# Patient Record
Sex: Male | Born: 1970 | Race: White | Hispanic: No | Marital: Single | Smoking: Never smoker
Health system: Southern US, Community
[De-identification: ages and names within clinical notes are randomized; demographics above are authoritative.]

---

## 2005-10-24 ENCOUNTER — Ambulatory Visit: Payer: Self-pay | Admitting: Family Medicine

## 2005-10-24 ENCOUNTER — Inpatient Hospital Stay (HOSPITAL_COMMUNITY): Admission: EM | Admit: 2005-10-24 | Discharge: 2005-10-27 | Payer: Self-pay | Admitting: Emergency Medicine

## 2005-11-01 ENCOUNTER — Ambulatory Visit: Payer: Self-pay | Admitting: Sports Medicine
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
## 2016-07-07 ENCOUNTER — Ambulatory Visit (INDEPENDENT_AMBULATORY_CARE_PROVIDER_SITE_OTHER): Payer: 59 | Admitting: Physician Assistant

## 2016-07-07 VITALS — BP 122/68 | HR 112 | Temp 98.7°F | Resp 17 | Ht 73.5 in | Wt 166.0 lb

## 2016-07-07 DIAGNOSIS — A084 Viral intestinal infection, unspecified: Secondary | ICD-10-CM

## 2016-07-07 NOTE — Progress Notes (Signed)
Patient ID: Matthew Farmer, male     DOB: 1970-07-04, 46 y.o.    MRN: [id number]  PCP: No PCP Per Patient  Chief Complaint  Patient presents with  . Nausea  . Emesis    Subjective:   This patient is new to this practice and presents for work note.  Wednesday morning developed nausea. No trigger identified. Stayed home from work. Developed vomiting on Thursday night. Vomiting resolved, but nausea persisted and he wasn't really eating until Sunday.  No diarrhea. No fever, chills. Urine darkened and reduced volume, but returned to normal.  Feels back to normal now, but work requires a note to return after his absence for illness.   Review of Systems As above, all resolved.  Prior to Admission medications   Not on File     Not on File   There are no active problems to display for this patient.    Family History  Problem Relation Age of Onset  . Macular degeneration Mother      Social History   Social History  . Marital status: Single    Spouse name: N/A  . Number of children: 0  . Years of education: some college   Occupational History  . [employer]    Social History Main Topics  . Smoking status: Never Smoker  . Smokeless tobacco: Never Used  . Alcohol use No  . Drug use: No  . Sexual activity: No   Other Topics Concern  . Not on file   Social History Narrative   Lives alone.   Mother, step-father and twin brother life in [place], [place].   Sister lives in [place], [place].         Objective:  Physical Exam  Constitutional: He is oriented to person, place, and time. He appears well-developed and well-nourished. He is active and cooperative. No distress.  BP 122/68 (BP Location: Left Arm, Cuff Size: Large)   Pulse (!) 112   Temp 98.7 F (37.1 C) (Oral)   Resp 17   Ht 6' 1.5" (1.867 m)   Wt 166 lb (75.3 kg)   SpO2 97%   BMI 21.60 kg/m   HENT:  Head: Normocephalic and atraumatic.  Right Ear: Hearing normal.    Left Ear: Hearing normal.  Eyes: Conjunctivae are normal. No scleral icterus.  Neck: Normal range of motion. Neck supple. No thyromegaly present.  Cardiovascular: Normal rate, regular rhythm and normal heart sounds.   Pulses:      Radial pulses are 2+ on the right side, and 2+ on the left side.  Pulmonary/Chest: Effort normal and breath sounds normal.  Lymphadenopathy:       Head (right side): No tonsillar, no preauricular, no posterior auricular and no occipital adenopathy present.       Head (left side): No tonsillar, no preauricular, no posterior auricular and no occipital adenopathy present.    He has no cervical adenopathy.       Right: No supraclavicular adenopathy present.       Left: No supraclavicular adenopathy present.  Neurological: He is alert and oriented to person, place, and time. No sensory deficit.  Skin: Skin is warm, dry and intact. No rash noted. No cyanosis or erythema. Nails show no clubbing.  Psychiatric: He has a normal mood and affect. His speech is normal and behavior is normal.        Assessment & Plan:  1. Viral gastroenteritis Resolved. Note to RTW without restrictions.  No Follow-up on file.   Fernande Bras, PA-C Primary [facility] at [facility]

## 2016-07-07 NOTE — Patient Instructions (Signed)
     IF you received an x-ray today, you will receive an invoice from [facility]. Please contact [facility] at [phone] with questions or concerns regarding your invoice.   IF you received labwork today, you will receive an invoice from [facility]. Please contact [facility] at [phone] with questions or concerns regarding your invoice.   Our billing staff will not be able to assist you with questions regarding bills from these companies.  You will be contacted with the lab results as soon as they are available. The fastest way to get your results is to activate your My Chart account. Instructions are located on the last page of this paperwork. If you have not heard from us regarding the results in 2 weeks, please contact this office.     

## 2018-11-07 ENCOUNTER — Ambulatory Visit (INDEPENDENT_AMBULATORY_CARE_PROVIDER_SITE_OTHER): Payer: 59 | Admitting: Emergency Medicine

## 2018-11-07 ENCOUNTER — Encounter: Payer: Self-pay | Admitting: Emergency Medicine

## 2018-11-07 ENCOUNTER — Other Ambulatory Visit: Payer: Self-pay

## 2018-11-07 VITALS — BP 127/83 | HR 65 | Temp 98.0°F | Resp 14 | Wt 140.8 lb

## 2018-11-07 DIAGNOSIS — M79601 Pain in right arm: Secondary | ICD-10-CM

## 2018-11-07 DIAGNOSIS — M62838 Other muscle spasm: Secondary | ICD-10-CM | POA: Diagnosis not present

## 2018-11-07 DIAGNOSIS — M7918 Myalgia, other site: Secondary | ICD-10-CM

## 2018-11-07 DIAGNOSIS — S29012A Strain of muscle and tendon of back wall of thorax, initial encounter: Secondary | ICD-10-CM | POA: Diagnosis not present

## 2018-11-07 MED ORDER — CYCLOBENZAPRINE HCL 10 MG PO TABS: 10.0000 mg | ORAL_TABLET | Freq: Every day | ORAL | 0 refills | Status: DC

## 2018-11-07 MED ORDER — MELOXICAM 15 MG PO TABS: 15.0000 mg | ORAL_TABLET | Freq: Every day | ORAL | 0 refills | Status: DC

## 2018-11-07 NOTE — Patient Instructions (Addendum)
   If you have lab work done today you will be contacted with your lab results within the next 2 weeks.  If you have not heard from us then please contact us. The fastest way to get your results is to register for My Chart.   IF you received an x-ray today, you will receive an invoice from [facility]. Please contact [facility] at [phone] with questions or concerns regarding your invoice.   IF you received labwork today, you will receive an invoice from [facility]. Please contact [facility] at [phone] with questions or concerns regarding your invoice.   Our billing staff will not be able to assist you with questions regarding bills from these companies.  You will be contacted with the lab results as soon as they are available. The fastest way to get your results is to activate your My Chart account. Instructions are located on the last page of this paperwork. If you have not heard from us regarding the results in 2 weeks, please contact this office.     Muscle Strain A muscle strain is an injury that happens when a muscle is stretched longer than normal. This can happen during a fall, sports, or lifting. This can tear some muscle fibers. Usually, recovery from muscle strain takes 1-2 weeks. Complete healing normally takes 5-6 weeks. This condition is first treated with PRICE therapy. This involves:  Protecting your muscle from being injured again.  Resting your injured muscle.  Icing your injured muscle.  Applying pressure (compression) to your injured muscle. This may be done with a splint or elastic bandage.  Raising (elevating) your injured muscle. Your doctor may also recommend medicine for pain. Follow these instructions at home: If you have a splint:  Wear the splint as told by your doctor. Take it off only as told by your doctor.  Loosen the splint if your fingers or toes tingle, get numb, or turn cold and blue.  Keep the splint clean.  If the  splint is not waterproof: ? Do not let it get wet. ? Cover it with a watertight covering when you take a bath or a shower. Managing pain, stiffness, and swelling   If directed, put ice on your injured area. ? If you have a removable splint, take it off as told by your doctor. ? Put ice in a plastic bag. ? Place a towel between your skin and the bag. ? Leave the ice on for 20 minutes, 2-3 times a day.  Move your fingers or toes often. This helps to avoid stiffness and lessen swelling.  Raise your injured area above the level of your heart while you are sitting or lying down.  Wear an elastic bandage as told by your doctor. Make sure it is not too tight. General instructions  Take over-the-counter and prescription medicines only as told by your doctor.  Limit your activity. Rest your injured muscle as told by your doctor. Your doctor may say that gentle movements are okay.  If physical therapy was prescribed, do exercises as told by your doctor.  Do not put pressure on any part of the splint until it is fully hardened. This may take many hours.  Do not use any products that contain nicotine or tobacco, such as cigarettes and e-cigarettes. These can delay bone healing. If you need help quitting, ask your doctor.  Warm up before you exercise. This helps to prevent more muscle strains.  Ask your doctor when it is safe to drive   if you have a splint.  Keep all follow-up visits as told by your doctor. This is important. Contact a doctor if:  You have more pain or swelling in your injured area. Get help right away if:  You have any of these problems in your injured area: ? You have numbness. ? You have tingling. ? You lose a lot of strength. Summary  A muscle strain is an injury that happens when a muscle is stretched longer than normal.  This condition is first treated with PRICE therapy. This includes protecting, resting, icing, adding pressure, and raising your  injury.  Limit your activity. Rest your injured muscle as told by your doctor. Your doctor may say that gentle movements are okay.  Warm up before you exercise. This helps to prevent more muscle strains. This information is not intended to replace advice given to you by your health care provider. Make sure you discuss any questions you have with your health care provider. Document Released: 12/09/2007 Document Revised: 04/27/2018 Document Reviewed: 04/07/2016 Elsevier Patient Education  2020 Elsevier Inc.  

## 2018-11-07 NOTE — Progress Notes (Signed)
Matthew Farmer 48 y.o.   Chief Complaint  Patient presents with  . Back Pain    Upper back pain with no known injury. Troubke lifting right arm due to the upper back pain. Had similiar problem pain in Feb 20 last for 3 weeks. Patient also would like a work note from 8/17-8/25 was not able to go to work and could not get a sooner appt    HISTORY OF PRESENT ILLNESS: This is a 48 y.o. male complaining of right-sided upper back pain that started on February 17th.  Had been working longer hours with increased workload prior to symptoms.  Was able to rest and use ice and heat with some relief but still has some residual soreness with weakness in the right arm.  Overall however feels better and wants to return to work tomorrow.  Needs a work note.  No other significant symptoms or injuries.  HPI   Prior to Admission medications   Not on File    Not on File  There are no active problems to display for this patient.   History reviewed. No pertinent past medical history.  History reviewed. No pertinent surgical history.  Social History   Socioeconomic History  . Marital status: Single    Spouse name: Not on file  . Number of children: 0  . Years of education: some college  . Highest education level: Not on file  Occupational History  . Occupation: [employer]  Social Needs  . Financial resource strain: Not on file  . Food insecurity    Worry: Not on file    Inability: Not on file  . Transportation needs    Medical: Not on file    Non-medical: Not on file  Tobacco Use  . Smoking status: Never Smoker  . Smokeless tobacco: Never Used  Substance and Sexual Activity  . Alcohol use: No  . Drug use: No  . Sexual activity: Never  Lifestyle  . Physical activity    Days per week: Not on file    Minutes per session: Not on file  . Stress: Not on file  Relationships  . Social Herbalist on phone: Not on file    Gets together: Not on file    Attends  religious service: Not on file    Active member of club or organization: Not on file    Attends meetings of clubs or organizations: Not on file    Relationship status: Not on file  . Intimate partner violence    Fear of current or ex partner: Not on file    Emotionally abused: Not on file    Physically abused: Not on file    Forced sexual activity: Not on file  Other Topics Concern  . Not on file  Social History Narrative   Lives alone.   Mother, step-father and twin brother life in [place], [place].   Sister lives in [place], [place].    Family History  Problem Relation Age of Onset  . Macular degeneration Mother      Review of Systems  Constitutional: Negative.  Negative for chills and fever.  HENT: Negative.  Negative for congestion and sore throat.   Respiratory: Negative.  Negative for cough and shortness of breath.   Cardiovascular: Negative.  Negative for chest pain and palpitations.  Gastrointestinal: Negative.  Negative for abdominal pain, diarrhea, nausea and vomiting.  Musculoskeletal: Positive for back pain. Negative for joint pain and neck pain.  Skin: Negative.  Neurological: Negative for dizziness and headaches.  Endo/Heme/Allergies: Negative.   All other systems reviewed and are negative.  Today's Vitals   11/07/18 1032  BP: 127/83  Pulse: 65  Resp: 14  Temp: 98 F (36.7 C)  TempSrc: Oral  SpO2: 98%  Weight: 140 lb 12.8 oz (63.9 kg)   Body mass index is 18.32 kg/m.   Physical Exam Vitals signs reviewed.  Constitutional:      Appearance: Normal appearance.  HENT:     Head: Normocephalic.  Eyes:     Extraocular Movements: Extraocular movements intact.     Pupils: Pupils are equal, round, and reactive to light.  Neck:     Musculoskeletal: Normal range of motion and neck supple.  Cardiovascular:     Rate and Rhythm: Normal rate and regular rhythm.     Pulses: Normal pulses.     Heart sounds: Normal heart sounds.  Pulmonary:     Effort:  Pulmonary effort is normal.     Breath sounds: Normal breath sounds.  Musculoskeletal:     Comments: Back: Positive tenderness with muscle spasm to right trapezius muscle area. Right upper extremity: Full range of motion but complains of pain in the shoulder area.  Elbow and wrist within normal limits.  Right hand within normal limits.  Skin:    General: Skin is warm and dry.     Capillary Refill: Capillary refill takes less than 2 seconds.  Neurological:     General: No focal deficit present.     Mental Status: He is alert and oriented to person, place, and time.     Sensory: No sensory deficit.     Motor: No weakness.     Deep Tendon Reflexes: Reflexes normal.  Psychiatric:        Mood and Affect: Mood normal.        Behavior: Behavior normal.      ASSESSMENT & PLAN: Matthew Farmer was seen today for back pain.  Diagnoses and all orders for this visit:  Upper back strain, initial encounter  Right arm pain  Musculoskeletal pain -     meloxicam (MOBIC) 15 MG tablet; Take 1 tablet (15 mg total) by mouth daily.  Muscle spasm -     cyclobenzaprine (FLEXERIL) 10 MG tablet; Take 1 tablet (10 mg total) by mouth at bedtime.   Work note written and printed for patient.   Patient Instructions       If you have lab work done today you will be contacted with your lab results within the next 2 weeks.  If you have not heard from [place] then please contact us. The fastest way to get your results is to register for My Chart.   IF you received an x-ray today, you will receive an invoice from [facility]. Please contact [facility] at [phone] with questions or concerns regarding your invoice.   IF you received labwork today, you will receive an invoice from [facility]. Please contact [facility] at [phone] with questions or concerns regarding your invoice.   Our billing staff will not be able to assist you with questions regarding bills from these companies.  You  will be contacted with the lab results as soon as they are available. The fastest way to get your results is to activate your My Chart account. Instructions are located on the last page of this paperwork. If you have not heard from [place] regarding the results in 2 weeks, please contact this office.      Muscle  Strain A muscle strain is an injury that happens when a muscle is stretched longer than normal. This can happen during a fall, sports, or lifting. This can tear some muscle fibers. Usually, recovery from muscle strain takes 1-2 weeks. Complete healing normally takes 5-6 weeks. This condition is first treated with PRICE therapy. This involves:  Protecting your muscle from being injured again.  Resting your injured muscle.  Icing your injured muscle.  Applying pressure (compression) to your injured muscle. This may be done with a splint or elastic bandage.  Raising (elevating) your injured muscle. Your doctor may also recommend medicine for pain. Follow these instructions at home: If you have a splint:  Wear the splint as told by your doctor. Take it off only as told by your doctor.  Loosen the splint if your fingers or toes tingle, get numb, or turn cold and blue.  Keep the splint clean.  If the splint is not waterproof: ? Do not let it get wet. ? Cover it with a watertight covering when you take a bath or a shower. Managing pain, stiffness, and swelling   If directed, put ice on your injured area. ? If you have a removable splint, take it off as told by your doctor. ? Put ice in a plastic bag. ? Place a towel between your skin and the bag. ? Leave the ice on for 20 minutes, 2-3 times a day.  Move your fingers or toes often. This helps to avoid stiffness and lessen swelling.  Raise your injured area above the level of your heart while you are sitting or lying down.  Wear an elastic bandage as told by your doctor. Make sure it is not too tight. General instructions   Take over-the-counter and prescription medicines only as told by your doctor.  Limit your activity. Rest your injured muscle as told by your doctor. Your doctor may say that gentle movements are okay.  If physical therapy was prescribed, do exercises as told by your doctor.  Do not put pressure on any part of the splint until it is fully hardened. This may take many hours.  Do not use any products that contain nicotine or tobacco, such as cigarettes and e-cigarettes. These can delay bone healing. If you need help quitting, ask your doctor.  Warm up before you exercise. This helps to prevent more muscle strains.  Ask your doctor when it is safe to drive if you have a splint.  Keep all follow-up visits as told by your doctor. This is important. Contact a doctor if:  You have more pain or swelling in your injured area. Get help right away if:  You have any of these problems in your injured area: ? You have numbness. ? You have tingling. ? You lose a lot of strength. Summary  A muscle strain is an injury that happens when a muscle is stretched longer than normal.  This condition is first treated with PRICE therapy. This includes protecting, resting, icing, adding pressure, and raising your injury.  Limit your activity. Rest your injured muscle as told by your doctor. Your doctor may say that gentle movements are okay.  Warm up before you exercise. This helps to prevent more muscle strains. This information is not intended to replace advice given to you by your health care provider. Make sure you discuss any questions you have with your health care provider. Document Released: 12/09/2007 Document Revised: 04/27/2018 Document Reviewed: 04/07/2016 Elsevier Patient Education  2020 [employer]  Shailah Gibbins, MD [facility] 

## 2018-12-03 ENCOUNTER — Other Ambulatory Visit: Payer: Self-pay | Admitting: Emergency Medicine

## 2018-12-03 DIAGNOSIS — M7918 Myalgia, other site: Secondary | ICD-10-CM

## 2018-12-03 DIAGNOSIS — M62838 Other muscle spasm: Secondary | ICD-10-CM

## 2018-12-04 NOTE — Telephone Encounter (Signed)
Requested medication (s) are due for refill today: yes  Requested medication (s) are on the active medication list: yes  Last refill:  11/07/2018  Future visit scheduled: no  Notes to clinic:  Refill cannot be delegated    Requested Prescriptions  Pending Prescriptions Disp Refills   cyclobenzaprine (FLEXERIL) 10 MG tablet [Pharmacy Med Name: CYCLOBENZAPRINE 10 MG TABLET] 30 tablet 0    Sig: TAKE 1 TABLET BY MOUTH EVERYDAY AT BEDTIME     Not Delegated - Analgesics:  Muscle Relaxants Failed - 12/03/2018  5:31 PM      Failed - This refill cannot be delegated      Passed - Valid encounter within last 6 months    Recent Outpatient Visits          3 weeks ago Upper back strain, initial encounter   Primary Care at [place], [place], [place]   2 years ago Viral gastroenteritis   Primary Care at [facility], [place], [place]              meloxicam ([place]) 15 MG tablet [Pharmacy Med Name: MELOXICAM 15 MG TABLET] 30 tablet 0    Sig: TAKE 1 TABLET BY MOUTH EVERY DAY     Analgesics:  COX2 Inhibitors Failed - 12/03/2018  5:31 PM      Failed - HGB in normal range and within 360 days    No results found for: HGB, HGBKUC, HGBPOCKUC       Failed - Cr in normal range and within 360 days    No results found for: CREATININE       Passed - Patient is not pregnant      Passed - Valid encounter within last 12 months    Recent Outpatient Visits          3 weeks ago Upper back strain, initial encounter   Primary Care at [place], [place], [place]   2 years ago Viral gastroenteritis   Primary Care at [place], [place], [place]

## 2018-12-06 NOTE — Telephone Encounter (Signed)
Requested Prescriptions   Pending Prescriptions Disp Refills  . cyclobenzaprine (FLEXERIL) 10 MG tablet [Pharmacy Med Name: CYCLOBENZAPRINE 10 MG TABLET] 30 tablet 0    Sig: TAKE 1 TABLET BY MOUTH EVERYDAY AT BEDTIME  . meloxicam (MOBIC) 15 MG tablet [Pharmacy Med Name: MELOXICAM 15 MG TABLET] 30 tablet 0    Sig: TAKE 1 TABLET BY MOUTH EVERY DAY    Last OV 11/07/2018  Last written 11/07/2018

## 2019-01-08 ENCOUNTER — Other Ambulatory Visit: Payer: Self-pay | Admitting: Emergency Medicine

## 2019-01-08 DIAGNOSIS — M62838 Other muscle spasm: Secondary | ICD-10-CM

## 2019-01-08 DIAGNOSIS — M7918 Myalgia, other site: Secondary | ICD-10-CM

## 2019-01-08 NOTE — Telephone Encounter (Signed)
Requested medication (s) are due for refill today: yes  Requested medication (s) are on the active medication list: yes  Last refill:  12/06/18 for both  Future visit scheduled: no  Notes to clinic:  Analgesics muscle relaxants failed, not delegated  And COX2 inhibitors failed  Requested Prescriptions  Pending Prescriptions Disp Refills   cyclobenzaprine (FLEXERIL) 10 MG tablet [Pharmacy Med Name: CYCLOBENZAPRINE 10 MG TABLET] 30 tablet 0    Sig: TAKE 1 TABLET BY MOUTH EVERYDAY AT BEDTIME     Not Delegated - Analgesics:  Muscle Relaxants Failed - 01/08/2019  4:06 PM      Failed - This refill cannot be delegated      Passed - Valid encounter within last 6 months    Recent Outpatient Visits          2 months ago Upper back strain, initial encounter   Primary Care at [place], [place], [place]   2 years ago Viral gastroenteritis   Primary Care at [facility], [place], [place]              meloxicam ([place]) 15 MG tablet [Pharmacy Med Name: MELOXICAM 15 MG TABLET] 30 tablet 0    Sig: TAKE 1 TABLET BY MOUTH EVERY DAY     Analgesics:  COX2 Inhibitors Failed - 01/08/2019  4:06 PM      Failed - HGB in normal range and within 360 days    No results found for: HGB, HGBKUC, HGBPOCKUC       Failed - Cr in normal range and within 360 days    No results found for: CREATININE       Passed - Patient is not pregnant      Passed - Valid encounter within last 12 months    Recent Outpatient Visits          2 months ago Upper back strain, initial encounter   Primary Care at [place], Ines Bloomer, [place]   2 years ago Viral gastroenteritis   Primary Care at [place], [place], [place]

## 2020-12-22 ENCOUNTER — Encounter: Payer: Self-pay | Admitting: Family Medicine
